# Patient Record
Sex: Male | Born: 1965 | Race: Black or African American | Hispanic: No | Smoking: Never smoker
Health system: Southern US, Community
[De-identification: ages and names within clinical notes are randomized; demographics above are authoritative.]

## PROBLEM LIST (undated history)

## (undated) DIAGNOSIS — R600 Localized edema: Secondary | ICD-10-CM

## (undated) DIAGNOSIS — K259 Gastric ulcer, unspecified as acute or chronic, without hemorrhage or perforation: Secondary | ICD-10-CM

## (undated) DIAGNOSIS — R7303 Prediabetes: Secondary | ICD-10-CM

## (undated) DIAGNOSIS — E7800 Pure hypercholesterolemia, unspecified: Secondary | ICD-10-CM

## (undated) DIAGNOSIS — M1990 Unspecified osteoarthritis, unspecified site: Secondary | ICD-10-CM

## (undated) DIAGNOSIS — K219 Gastro-esophageal reflux disease without esophagitis: Secondary | ICD-10-CM

## (undated) DIAGNOSIS — I10 Essential (primary) hypertension: Secondary | ICD-10-CM

## (undated) DIAGNOSIS — M2550 Pain in unspecified joint: Secondary | ICD-10-CM

## (undated) HISTORY — PX: UPPER GI ENDOSCOPY: SHX6162

## (undated) HISTORY — PX: UMBILICAL HERNIA REPAIR: SHX196

## (undated) HISTORY — DX: Pain in unspecified joint: M25.50

## (undated) HISTORY — DX: Unspecified osteoarthritis, unspecified site: M19.90

## (undated) HISTORY — PX: OTHER SURGICAL HISTORY: SHX169

## (undated) HISTORY — DX: Pure hypercholesterolemia, unspecified: E78.00

## (undated) HISTORY — DX: Gastric ulcer, unspecified as acute or chronic, without hemorrhage or perforation: K25.9

## (undated) HISTORY — DX: Localized edema: R60.0

## (undated) HISTORY — DX: Prediabetes: R73.03

## (undated) HISTORY — DX: Essential (primary) hypertension: I10

---

## 2011-06-17 DIAGNOSIS — E785 Hyperlipidemia, unspecified: Secondary | ICD-10-CM | POA: Insufficient documentation

## 2011-06-17 DIAGNOSIS — I10 Essential (primary) hypertension: Secondary | ICD-10-CM | POA: Insufficient documentation

## 2020-04-10 DIAGNOSIS — E1169 Type 2 diabetes mellitus with other specified complication: Secondary | ICD-10-CM | POA: Insufficient documentation

## 2020-04-10 DIAGNOSIS — M1611 Unilateral primary osteoarthritis, right hip: Secondary | ICD-10-CM | POA: Insufficient documentation

## 2020-04-10 DIAGNOSIS — I152 Hypertension secondary to endocrine disorders: Secondary | ICD-10-CM | POA: Insufficient documentation

## 2020-04-10 DIAGNOSIS — E785 Hyperlipidemia, unspecified: Secondary | ICD-10-CM | POA: Insufficient documentation

## 2020-04-10 DIAGNOSIS — E1159 Type 2 diabetes mellitus with other circulatory complications: Secondary | ICD-10-CM | POA: Insufficient documentation

## 2020-04-10 DIAGNOSIS — E119 Type 2 diabetes mellitus without complications: Secondary | ICD-10-CM | POA: Insufficient documentation

## 2020-06-29 ENCOUNTER — Encounter (INDEPENDENT_AMBULATORY_CARE_PROVIDER_SITE_OTHER): Payer: Self-pay | Admitting: Bariatrics

## 2020-06-30 ENCOUNTER — Encounter (INDEPENDENT_AMBULATORY_CARE_PROVIDER_SITE_OTHER): Payer: Self-pay | Admitting: Bariatrics

## 2020-06-30 ENCOUNTER — Other Ambulatory Visit: Payer: Self-pay

## 2020-06-30 ENCOUNTER — Ambulatory Visit (INDEPENDENT_AMBULATORY_CARE_PROVIDER_SITE_OTHER): Payer: 59 | Admitting: Bariatrics

## 2020-06-30 VITALS — BP 143/87 | HR 67 | Temp 97.8°F | Ht 70.0 in | Wt 307.0 lb

## 2020-06-30 DIAGNOSIS — R5383 Other fatigue: Secondary | ICD-10-CM | POA: Diagnosis not present

## 2020-06-30 DIAGNOSIS — E669 Obesity, unspecified: Secondary | ICD-10-CM

## 2020-06-30 DIAGNOSIS — I10 Essential (primary) hypertension: Secondary | ICD-10-CM | POA: Diagnosis not present

## 2020-06-30 DIAGNOSIS — E559 Vitamin D deficiency, unspecified: Secondary | ICD-10-CM

## 2020-06-30 DIAGNOSIS — E785 Hyperlipidemia, unspecified: Secondary | ICD-10-CM

## 2020-06-30 DIAGNOSIS — Z1331 Encounter for screening for depression: Secondary | ICD-10-CM

## 2020-06-30 DIAGNOSIS — Z6841 Body Mass Index (BMI) 40.0 and over, adult: Secondary | ICD-10-CM

## 2020-06-30 DIAGNOSIS — Z9189 Other specified personal risk factors, not elsewhere classified: Secondary | ICD-10-CM

## 2020-06-30 DIAGNOSIS — Z0289 Encounter for other administrative examinations: Secondary | ICD-10-CM

## 2020-06-30 DIAGNOSIS — E1169 Type 2 diabetes mellitus with other specified complication: Secondary | ICD-10-CM

## 2020-06-30 DIAGNOSIS — R0602 Shortness of breath: Secondary | ICD-10-CM | POA: Diagnosis not present

## 2020-06-30 DIAGNOSIS — E66813 Obesity, class 3: Secondary | ICD-10-CM

## 2020-07-01 ENCOUNTER — Encounter (INDEPENDENT_AMBULATORY_CARE_PROVIDER_SITE_OTHER): Payer: Self-pay | Admitting: Bariatrics

## 2020-07-01 DIAGNOSIS — E559 Vitamin D deficiency, unspecified: Secondary | ICD-10-CM | POA: Insufficient documentation

## 2020-07-01 LAB — INSULIN, RANDOM: INSULIN: 13.5 u[IU]/mL (ref 2.6–24.9)

## 2020-07-01 LAB — VITAMIN D 25 HYDROXY (VIT D DEFICIENCY, FRACTURES): Vit D, 25-Hydroxy: 22.5 ng/mL — ABNORMAL LOW (ref 30.0–100.0)

## 2020-07-01 LAB — C-PEPTIDE: C-Peptide: 3.5 ng/mL (ref 1.1–4.4)

## 2020-07-01 NOTE — Progress Notes (Signed)
Chief Complaint:   OBESITY William Blevins (MR# [id number]) is a 55 y.o. male who presents for evaluation and treatment of obesity and related comorbidities. Current BMI is Body mass index is 44.05 kg/m. [name] has been struggling with his weight for many years and has been unsuccessful in either losing weight, maintaining weight loss, or reaching his healthy weight goal.  [name] is planning on having hip surgery (right hip), and he is considering and needs his BMI <40 or less. He does like to cook but he notes time as an obstacle.  [name] is currently in the action stage of change and ready to dedicate time achieving and maintaining a healthier weight. [name] is interested in becoming our patient and working on intensive lifestyle modifications including (but not limited to) diet and exercise for weight loss.  [name]'s habits were reviewed today and are as follows: His family eats meals together, his desired weight loss is 47 lbs, he has been heavy most of his life, he started gaining weight 2 years ago, his heaviest weight ever was 337 pounds, he has significant food cravings issues, he skips meals frequently, he is frequently drinking liquids with calories, he frequently makes poor food choices, he frequently eats larger portions than normal and he struggles with emotional eating.  Depression Screen [name]'s Food and Mood (modified PHQ-9) score was 3.  Depression screen PHQ 2/9 06/30/2020  Decreased Interest 1  Down, Depressed, Hopeless 1  PHQ - 2 Score 2  Altered sleeping 0  Tired, decreased energy 1  Change in appetite 0  Feeling bad or failure about yourself  0  Trouble concentrating 0  Moving slowly or fidgety/restless 0  Suicidal thoughts 0  PHQ-9 Score 3  Difficult doing work/chores Not difficult at all   Subjective:   1. Other fatigue Lawyer admits to daytime somnolence and denies waking up still tired. Patent has a history of symptoms of daytime fatigue. [name]  generally gets 6 or 7 hours of sleep per night, and states that he has generally restful sleep. Snoring is present. Apneic episodes are not present. Epworth Sleepiness Score is 6.  2. SOB (shortness of breath) on exertion Sofia notes increasing shortness of breath with exercising and seems to be worsening over time with weight gain. He notes getting out of breath sooner with activity than he used to. This has not gotten worse recently. [name] denies shortness of breath at rest or orthopnea.  3. Diabetes mellitus type 2 in obese (HCC) [name] is not on medications, and his last A1c was 6.0. he has a family history of diabetes mellitus with his grandfather.  4. Essential hypertension [name]'s blood pressure is reasonably well controlled.  5. Hyperlipidemia associated with type 2 diabetes mellitus (HCC) [name] is not on medications currently.  6. Vitamin D deficiency [name] is not on Vit D currently.  7. At risk for hypoglycemia [name] is at increased risk for hypoglycemia due to changes in diet, diagnosis of diabetes mellitus II.  Assessment/Plan:   1. Other fatigue Burhanuddin does feel that his weight is causing his energy to be lower than it should be. Fatigue may be related to obesity, depression or many other causes. Labs will be ordered, and in the meanwhile, [name] will focus on self care including making healthy food choices, increasing physical activity and focusing on stress reduction.  - EKG 12-Lead - Insulin, random - VITAMIN D 25 Hydroxy (Vit-D Deficiency, Fractures)  2. SOB (shortness of breath) on exertion [name] does  feel that he gets out of breath more easily that he used to when he exercises. [name]'s shortness of breath appears to be obesity related and exercise induced. He has agreed to work on weight loss and gradually increase exercise to treat his exercise induced shortness of breath. Will continue to monitor closely.  3. Diabetes mellitus type 2 in obese [name]) We  will check labs today, and we discussed GLP-1's briefly. Good blood sugar control is important to decrease the likelihood of diabetic complications such as nephropathy, neuropathy, limb loss, blindness, coronary artery disease, and death. Intensive lifestyle modification including diet, exercise and weight loss are the first line of treatment for diabetes.   - Insulin, random - C-peptide  4. Essential hypertension [name] will continue his medications, and will work on healthy weight loss and exercise to improve blood pressure control. We will watch for signs of hypotension as he continues his lifestyle modifications.  5. Hyperlipidemia associated with type 2 diabetes mellitus (HCC) Cardiovascular risk and specific lipid/LDL goals reviewed. We discussed several lifestyle modifications today. [name] will work on diet, exercise and weight loss efforts. He is to decrease saturated fats, increase PUFAs and MUFAs. Orders and follow up as documented in patient record.   Counseling Intensive lifestyle modifications are the first line treatment for this issue. . Dietary changes: Increase soluble fiber. Decrease simple carbohydrates. . Exercise changes: Moderate to vigorous-intensity aerobic activity 150 minutes per week if tolerated. . Lipid-lowering medications: see documented in medical record.  6. Vitamin D deficiency Low Vitamin D level contributes to fatigue and are associated with obesity, breast, and colon cancer. We will check labs today. [name] will follow-up for routine testing of Vitamin D, at least 2-3 times per year to avoid over-replacement.  - VITAMIN D 25 Hydroxy (Vit-D Deficiency, Fractures)  7. Depression screen [name] had a negative depression screening. Depression is commonly associated with obesity and often results in emotional eating behaviors. We will monitor this closely and work on CBT to help improve the non-hunger eating patterns. Referral to Psychology may be required if no  improvement is seen as he continues in our clinic.  8. At risk for hypoglycemia [name] was given approximately 15 minutes of counseling today regarding prevention of hypoglycemia. He was advised of symptoms of hypoglycemia. Summit was instructed to avoid skipping meals, eat regular protein rich meals and schedule low calorie snacks as needed.   Repetitive spaced learning was employed today to elicit superior memory formation and behavioral change  9. Obesity, current BMI 44 Ponce is currently in the action stage of change and his goal is to continue with weight loss efforts. I recommend Jermaine begin the structured treatment plan as follows:   He has agreed to the Category 2 Plan or keeping a food journal and adhering to recommended goals of 1200 calories and 80 grams of protein daily.  Intentional eating was discussed. He is to stop all juice, no sugary drinks. I reviewed labs from 04/03/2020 and 06/26/2020 with the patient today.  Exercise goals: No exercise has been prescribed at this time.   Behavioral modification strategies: increasing lean protein intake, decreasing simple carbohydrates, increasing vegetables, increasing water intake, decreasing eating out, no skipping meals, meal planning and cooking strategies, keeping healthy foods in the home and planning for success.  He was informed of the importance of frequent follow-up visits to maximize his success with intensive lifestyle modifications for his multiple health conditions. He was informed we would discuss his lab results at his next  visit unless there is a critical issue that needs to be addressed sooner. Abdifatah agreed to keep his next visit at the agreed upon time to discuss these results.  Objective:   Blood pressure (!) 143/87, pulse 67, temperature 97.8 F (36.6 C), height 5\' 10"  (1.778 m), weight (!) 307 lb (139.3 kg), SpO2 97 %. Body mass index is 44.05 kg/m.  EKG: Normal sinus rhythm, rate 58 BPM.  Indirect  Calorimeter completed today shows a VO2 of 229 and a REE of 1592.  His calculated basal metabolic rate is thus his basal metabolic rate is worse than expected.  General: Cooperative, alert, well developed, in no acute distress. HEENT: Conjunctivae and lids unremarkable. Cardiovascular: Regular rhythm.  Lungs: Normal work of breathing. Neurologic: No focal deficits.   No results found for: CREATININE, BUN, NA, K, CL, CO2 No results found for: ALT, AST, GGT, ALKPHOS, BILITOT No results found for: HGBA1C Lab Results  Component Value Date   INSULIN 13.5 06/30/2020   No results found for: TSH No results found for: CHOL, HDL, LDLCALC, LDLDIRECT, TRIG, CHOLHDL No results found for: WBC, HGB, HCT, MCV, PLT No results found for: IRON, TIBC, FERRITIN  Attestation Statements:   Reviewed by clinician on day of visit: allergies, medications, problem list, medical history, surgical history, family history, social history, and previous encounter notes.   07/02/2020, am acting as Trude Mcburney for Energy manager, DO.  I have reviewed the above documentation for accuracy and completeness, and I agree with the above. [employer], DO

## 2020-07-14 ENCOUNTER — Other Ambulatory Visit: Payer: Self-pay

## 2020-07-14 ENCOUNTER — Ambulatory Visit (INDEPENDENT_AMBULATORY_CARE_PROVIDER_SITE_OTHER): Payer: 59 | Admitting: Bariatrics

## 2020-07-14 ENCOUNTER — Encounter (INDEPENDENT_AMBULATORY_CARE_PROVIDER_SITE_OTHER): Payer: Self-pay | Admitting: Bariatrics

## 2020-07-14 VITALS — BP 145/77 | HR 61 | Temp 98.0°F | Ht 70.0 in | Wt 304.0 lb

## 2020-07-14 DIAGNOSIS — E1169 Type 2 diabetes mellitus with other specified complication: Secondary | ICD-10-CM

## 2020-07-14 DIAGNOSIS — Z6841 Body Mass Index (BMI) 40.0 and over, adult: Secondary | ICD-10-CM

## 2020-07-14 DIAGNOSIS — E559 Vitamin D deficiency, unspecified: Secondary | ICD-10-CM

## 2020-07-14 DIAGNOSIS — Z9189 Other specified personal risk factors, not elsewhere classified: Secondary | ICD-10-CM | POA: Diagnosis not present

## 2020-07-14 DIAGNOSIS — E66813 Obesity, class 3: Secondary | ICD-10-CM

## 2020-07-14 MED ORDER — VITAMIN D (ERGOCALCIFEROL) 1.25 MG (50000 UNIT) PO CAPS: 50000.0000 [IU] | ORAL_CAPSULE | ORAL | 0 refills | Status: DC

## 2020-07-16 NOTE — Progress Notes (Signed)
Chief Complaint:   OBESITY William Blevins is here to discuss his progress with his obesity treatment plan along with follow-up of his obesity related diagnoses. William Blevins is on the Category 2 Plan and states he is following his eating plan approximately 50% of the time. William Blevins states he is not currently exercising.  Today's visit was #: 2 Starting weight: 307 lbs Starting date: 06/30/2020 Today's weight: 304 lbs Today's date: 07/14/2020 Total lbs lost to date: 3 Total lbs lost since last in-office visit: 3  Interim History: William Blevins is down 3 lbs since his last visit. He states that he had to go shopping and only done the plan for 1 week.  Subjective:   1. Vitamin D insufficiency William Blevins's Vit D level is 22.5. He is not on Vit D supplementation.  2. Type 2 diabetes mellitus with other specified complication, without long-term current use of insulin (HCC) William Blevins's last A1c was 6.0. His insulin level is 13.5 and C-peptide 3.5. He reports appetite is reasonable and denies cravings.  3. At risk for osteoporosis William Blevins is at higher risk of osteopenia and osteoporosis due to Vitamin D deficiency, obesity, and decreased activity.  Assessment/Plan:   1. Vitamin D insufficiency Low Vitamin D level contributes to fatigue and are associated with obesity, breast, and colon cancer. He agrees to start to take prescription Vitamin D @50 ,000 IU every week and will follow-up for routine testing of Vitamin D, at least 2-3 times per year to avoid over-replacement.  - Vitamin D, Ergocalciferol, (DRISDOL) 1.25 MG (50000 UNIT) CAPS capsule; Take 1 capsule (50,000 Units total) by mouth every 7 (seven) days.  Dispense: 5 capsule; Refill: 0  2. Type 2 diabetes mellitus with other specified complication, without long-term current use of insulin (HCC) Good blood sugar control is important to decrease the likelihood of diabetic complications such as nephropathy, neuropathy, limb loss, blindness, coronary artery  disease, and death. Intensive lifestyle modification including diet, exercise and weight loss are the first line of treatment for diabetes.   3. At risk for osteoporosis William Blevins was given approximately 15 minutes of osteoporosis prevention counseling today. William Blevins is at risk for osteopenia and osteoporosis due to his Vitamin D deficiency. He was encouraged to take his Vitamin D and follow his higher calcium diet and increase strengthening exercise to help strengthen his bones and decrease his risk of osteopenia and osteoporosis.  Repetitive spaced learning was employed today to elicit superior memory formation and behavioral change.  4. Obesity, current BMI 43 William Blevins is currently in the action stage of change. As such, his goal is to continue with weight loss efforts. He has agreed to the Category 2 Plan.   Meal plan Intentional eating Will adhere closely tot he plan Reviewed labs: Vit D, insulin, and C-peptide.  Exercise goals: As is  Behavioral modification strategies: increasing lean protein intake, decreasing simple carbohydrates, increasing vegetables, increasing water intake, decreasing eating out, no skipping meals, meal planning and cooking strategies, keeping healthy foods in the home, dealing with family or coworker sabotage, travel eating strategies, holiday eating strategies , celebration eating strategies and avoiding temptations.  William Blevins has agreed to follow-up with our clinic in 2 weeks. He was informed of the importance of frequent follow-up visits to maximize his success with intensive lifestyle modifications for his multiple health conditions.   Objective:   Blood pressure (!) 145/77, pulse 61, temperature 98 F (36.7 C), height 5\' 10"  (1.778 m), weight (!) 304 lb (137.9 kg), SpO2 93 %. Body mass  index is 43.62 kg/m.  General: Cooperative, alert, well developed, in no acute distress. HEENT: Conjunctivae and lids unremarkable. Cardiovascular: Regular rhythm.  Lungs:  Normal work of breathing. Neurologic: No focal deficits.   No results found for: CREATININE, BUN, NA, K, CL, CO2 No results found for: ALT, AST, GGT, ALKPHOS, BILITOT No results found for: HGBA1C Lab Results  Component Value Date   INSULIN 13.5 06/30/2020   No results found for: TSH No results found for: CHOL, HDL, LDLCALC, LDLDIRECT, TRIG, CHOLHDL No results found for: WBC, HGB, HCT, MCV, PLT No results found for: IRON, TIBC, FERRITIN   Attestation Statements:   Reviewed by clinician on day of visit: allergies, medications, problem list, medical history, surgical history, family history, social history, and previous encounter notes.  Edmund Hilda, CMA, am acting as Energy manager for [employer], DO.  I have reviewed the above documentation for accuracy and completeness, and I agree with the above. Corinna Capra, DO

## 2020-07-20 ENCOUNTER — Encounter (INDEPENDENT_AMBULATORY_CARE_PROVIDER_SITE_OTHER): Payer: Self-pay | Admitting: Bariatrics

## 2020-08-03 ENCOUNTER — Ambulatory Visit (INDEPENDENT_AMBULATORY_CARE_PROVIDER_SITE_OTHER): Payer: 59 | Admitting: Bariatrics

## 2020-08-15 ENCOUNTER — Other Ambulatory Visit (INDEPENDENT_AMBULATORY_CARE_PROVIDER_SITE_OTHER): Payer: Self-pay | Admitting: Bariatrics

## 2020-08-15 DIAGNOSIS — E559 Vitamin D deficiency, unspecified: Secondary | ICD-10-CM

## 2020-08-18 NOTE — Telephone Encounter (Signed)
Dr.Brown 

## 2020-08-20 ENCOUNTER — Ambulatory Visit (INDEPENDENT_AMBULATORY_CARE_PROVIDER_SITE_OTHER): Payer: 59 | Admitting: Bariatrics

## 2020-08-31 ENCOUNTER — Encounter (INDEPENDENT_AMBULATORY_CARE_PROVIDER_SITE_OTHER): Payer: Self-pay | Admitting: Bariatrics

## 2020-08-31 ENCOUNTER — Ambulatory Visit (INDEPENDENT_AMBULATORY_CARE_PROVIDER_SITE_OTHER): Payer: 59 | Admitting: Bariatrics

## 2020-08-31 ENCOUNTER — Other Ambulatory Visit: Payer: Self-pay

## 2020-08-31 VITALS — BP 138/75 | HR 66 | Temp 97.9°F | Ht 70.0 in | Wt 283.0 lb

## 2020-08-31 DIAGNOSIS — Z6841 Body Mass Index (BMI) 40.0 and over, adult: Secondary | ICD-10-CM

## 2020-08-31 DIAGNOSIS — I10 Essential (primary) hypertension: Secondary | ICD-10-CM | POA: Diagnosis not present

## 2020-08-31 DIAGNOSIS — Z9189 Other specified personal risk factors, not elsewhere classified: Secondary | ICD-10-CM

## 2020-08-31 DIAGNOSIS — E559 Vitamin D deficiency, unspecified: Secondary | ICD-10-CM | POA: Diagnosis not present

## 2020-09-04 ENCOUNTER — Other Ambulatory Visit: Payer: Self-pay | Admitting: Orthopaedic Surgery

## 2020-09-04 DIAGNOSIS — Z01818 Encounter for other preprocedural examination: Secondary | ICD-10-CM

## 2020-09-07 ENCOUNTER — Encounter (INDEPENDENT_AMBULATORY_CARE_PROVIDER_SITE_OTHER): Payer: Self-pay | Admitting: Bariatrics

## 2020-09-07 NOTE — Progress Notes (Signed)
Chief Complaint:   OBESITY William Blevins is here to discuss his progress with his obesity treatment plan along with follow-up of his obesity related diagnoses. William Blevins is on the Category 2 Plan and states he is following his eating plan approximately 99% of the time. William Blevins states he is walking 1 mile 5 times per week.  Today's visit was #: 3 Starting weight: 307 lbs Starting date: 06/30/2020 Today's weight: 283 lbs Today's date: 08/31/2020 Total lbs lost to date: 24 Total lbs lost since last in-office visit: 21  Interim History: William Blevins is down 21 lbs from 07/14/20. He is more active at work. He is getting adequate protein.  Subjective:   1. Vitamin D insufficiency William Blevins was prescribed Vit D at his last visit.  2. Essential hypertension William Blevins is taking Coreg, Norvasc, and Hyzaar. His BP is controlled.  3. At risk for dehydration William Blevins is at risk for dehydration due to exercise, obesity, and weather changes.  Assessment/Plan:   1. Vitamin D insufficiency Low Vitamin D level contributes to fatigue and are associated with obesity, breast, and colon cancer. He agrees to continue to take prescription Vitamin D @50 ,000 IU every week and will follow-up for routine testing of Vitamin D, at least 2-3 times per year to avoid over-replacement.  2. Essential hypertension William Blevins is working on healthy weight loss and exercise to improve blood pressure control. We will watch for signs of hypotension as he continues his lifestyle modifications. Continue current treatment plan.  3. At risk for dehydration William Blevins was given approximately 15 minutes dehydration prevention counseling today. William Blevins is at risk for dehydration due to weight loss and current medication(s). He was encouraged to hydrate and monitor fluid status to avoid dehydration as well as weight loss plateaus.    4. Obesity, current BMI 40.7  William Blevins is currently in the action stage of change. As such, his goal is to continue with  weight loss efforts. He has agreed to the Category 2 Plan.   Meal planning Intentional eating  Exercise goals:  As is  Behavioral modification strategies: increasing lean protein intake, decreasing simple carbohydrates, increasing vegetables, increasing water intake, decreasing eating out, no skipping meals, meal planning and cooking strategies, keeping healthy foods in the home, and planning for success.  Taber has agreed to follow-up with our clinic in 3-4 weeks. He was informed of the importance of frequent follow-up visits to maximize his success with intensive lifestyle modifications for his multiple health conditions.   Objective:   Blood pressure 138/75, pulse 66, temperature 97.9 F (36.6 C), height 5\' 10"  (1.778 m), weight 283 lb (128.4 kg), SpO2 97 %. Body mass index is 40.61 kg/m.  General: Cooperative, alert, well developed, in no acute distress. HEENT: Conjunctivae and lids unremarkable. Cardiovascular: Regular rhythm.  Lungs: Normal work of breathing. Neurologic: No focal deficits.   No results found for: CREATININE, BUN, NA, K, CL, CO2 No results found for: ALT, AST, GGT, ALKPHOS, BILITOT No results found for: HGBA1C Lab Results  Component Value Date   INSULIN 13.5 06/30/2020   No results found for: TSH No results found for: CHOL, HDL, LDLCALC, LDLDIRECT, TRIG, CHOLHDL Lab Results  Component Value Date   VD25OH 22.5 (L) 06/30/2020   No results found for: WBC, HGB, HCT, MCV, PLT No results found for: IRON, TIBC, FERRITIN   Attestation Statements:   Reviewed by clinician on day of visit: allergies, medications, problem list, medical history, surgical history, family history, social history, and previous encounter notes.  Edmund Hilda, CMA, am acting as Energy manager for [employer], DO.  I have reviewed the above documentation for accuracy and completeness, and I agree with the above. Corinna Capra, DO

## 2020-09-20 ENCOUNTER — Other Ambulatory Visit (INDEPENDENT_AMBULATORY_CARE_PROVIDER_SITE_OTHER): Payer: Self-pay | Admitting: Bariatrics

## 2020-09-20 DIAGNOSIS — E559 Vitamin D deficiency, unspecified: Secondary | ICD-10-CM

## 2020-09-28 ENCOUNTER — Telehealth (INDEPENDENT_AMBULATORY_CARE_PROVIDER_SITE_OTHER): Payer: 59 | Admitting: Bariatrics

## 2020-09-30 NOTE — Patient Instructions (Addendum)
DUE TO COVID-19 ONLY ONE VISITOR IS ALLOWED TO COME WITH YOU AND STAY IN THE WAITING ROOM ONLY DURING PRE OP AND PROCEDURE.   **NO VISITORS ARE ALLOWED IN THE SHORT STAY AREA OR RECOVERY ROOM!!**   Your procedure is scheduled on: 10/13/20   Report to [facility] Main  Entrance   Report to Short Stay at 5:15 AM   [facility])   Call this number if you have problems the morning of surgery [phone]   Do not eat food :After Midnight.   May have liquids until  4:30 AM day of surgery  CLEAR LIQUID DIET  Foods Allowed                                                                     Foods Excluded  Water, Black Coffee and tea, regular and decaf                 liquids that you cannot  Plain Jell-O in any flavor  (No red)                                       see through such as: Fruit ices (not with fruit pulp)                                              milk, soups, orange juice              Iced Popsicles (No red)                                                  All solid food                                   Apple juices Sports drinks like Gatorade (No red) Lightly seasoned clear broth or consume(fat free) Sugar, honey syrup     The day of surgery:  Drink ONE (1) Pre-Surgery Clear Ensure or G2 by 4:30 am the morning of surgery. Drink in one sitting. Do not sip.  This drink was given to you during your hospital  pre-op appointment visit. Nothing else to drink after completing the  Pre-Surgery Clear Ensure or G2.          If you have questions, please contact your surgeon's office.     Oral Hygiene is also important to reduce your risk of infection.                                    Remember - BRUSH YOUR TEETH THE MORNING OF SURGERY WITH YOUR REGULAR TOOTHPASTE   Do NOT smoke after Midnight   Take these medicines the morning of surgery with A SIP OF WATER: Amlodipine, Coreg, Omeprazole.  DO NOT TAKE ANY ORAL DIABETIC MEDICATIONS DAY OF YOUR SURGERY  How  to Manage Your Diabetes Before and After Surgery  Why is it important to control my blood sugar before and after surgery? Improving blood sugar levels before and after surgery helps healing and can limit problems. A way of improving blood sugar control is eating a healthy diet by:  Eating less sugar and carbohydrates  Increasing activity/exercise  Talking with your doctor about reaching your blood sugar goals High blood sugars (greater than 180 mg/dL) can raise your risk of infections and slow your recovery, so you will need to focus on controlling your diabetes during the weeks before surgery. Make sure that the doctor who takes care of your diabetes knows about your planned surgery including the date and location.  How do I manage my blood sugar before surgery? Check your blood sugar at least 4 times a day, starting 2 days before surgery, to make sure that the level is not too high or low. Check your blood sugar the morning of your surgery when you wake up and every 2 hours until you get to the Short Stay unit. If your blood sugar is less than 70 mg/dL, you will need to treat for low blood sugar: Do not take insulin. Treat a low blood sugar (less than 70 mg/dL) with  cup of clear juice (cranberry or apple), 4 glucose tablets, OR glucose gel. Recheck blood sugar in 15 minutes after treatment (to make sure it is greater than 70 mg/dL). If your blood sugar is not greater than 70 mg/dL on recheck, call [phone] for further instructions. Report your blood sugar to the short stay nurse when you get to Short Stay.  If you are admitted to the hospital after surgery: Your blood sugar will be checked by the staff and you will probably be given insulin after surgery (instead of oral diabetes medicines) to make sure you have good blood sugar levels. The goal for blood sugar control after surgery is 80-180 mg/dL.   WHAT DO I DO ABOUT MY DIABETES MEDICATION?  Do not take oral diabetes medicines  (pills) the morning of surgery.  THE NIGHT BEFORE SURGERY, take     units of       insulin.  Take day before     THE MORNING OF SURGERY, take   units of         insulin. None day of    Reviewed and Endorsed by [facility] Patient Education Committee, August 2015                               You may not have any metal on your body including jewelry, and body piercing             Do not wear lotions, powders, cologne, or deodorant              Men may shave face and neck.   Do not bring valuables to the hospital. [facility] IS NOT             RESPONSIBLE   FOR VALUABLES.   Contacts, dentures or bridgework may not be worn into surgery.    Patients discharged the day of surgery will not be allowed to drive home.   Special Instructions: Bring a copy of your healthcare power of attorney and living will documents         the day of  surgery if you haven't scanned them in before.              Please read over the following fact sheets you were given: IF YOU HAVE QUESTIONS ABOUT YOUR PRE OP INSTRUCTIONS PLEASE CALL [phone]   [facility] - Preparing for Surgery Before surgery, you can play an important role.  Because skin is not sterile, your skin needs to be as free of germs as possible.  You can reduce the number of germs on your skin by washing with CHG (chlorahexidine gluconate) soap before surgery.  CHG is an antiseptic cleaner which kills germs and bonds with the skin to continue killing germs even after washing. Please DO NOT use if you have an allergy to CHG or antibacterial soaps.  If your skin becomes reddened/irritated stop using the CHG and inform your nurse when you arrive at Short Stay. Do not shave (including legs and underarms) for at least 48 hours prior to the first CHG shower.  You may shave your face/neck.  Please follow these instructions carefully:  1.  Shower with CHG Soap the night before surgery and the  morning of surgery.  2.  If you choose to wash your hair,  wash your hair first as usual with your normal  shampoo.  3.  After you shampoo, rinse your hair and body thoroughly to remove the shampoo.                             4.  Use CHG as you would any other liquid soap.  You can apply chg directly to the skin and wash.  Gently with a scrungie or clean washcloth.  5.  Apply the CHG Soap to your body ONLY FROM THE NECK DOWN.   Do   not use on face/ open                           Wound or open sores. Avoid contact with eyes, ears mouth and   genitals (private parts).                       Wash face,  Genitals (private parts) with your normal soap.             6.  Wash thoroughly, paying special attention to the area where your    surgery  will be performed.  7.  Thoroughly rinse your body with warm water from the neck down.  8.  DO NOT shower/wash with your normal soap after using and rinsing off the CHG Soap.                9.  Pat yourself dry with a clean towel.            10.  Wear clean pajamas.            11.  Place clean sheets on your bed the night of your first shower and do not  sleep with pets. Day of Surgery : Do not apply any lotions/deodorants the morning of surgery.  Please wear clean clothes to the hospital/surgery center.  FAILURE TO FOLLOW THESE INSTRUCTIONS MAY RESULT IN THE CANCELLATION OF YOUR SURGERY  PATIENT SIGNATURE_________________________________  NURSE SIGNATURE__________________________________  ________________________________________________________________________   Rogelia Mire  An incentive spirometer is a tool that can help keep your lungs clear and active. This tool measures how well you are  filling your lungs with each breath. Taking long deep breaths may help reverse or decrease the chance of developing breathing (pulmonary) problems (especially infection) following: A long period of time when you are unable to move or be active. BEFORE THE PROCEDURE  If the spirometer includes an indicator to show  your best effort, your nurse or respiratory therapist will set it to a desired goal. If possible, sit up straight or lean slightly forward. Try not to slouch. Hold the incentive spirometer in an upright position. INSTRUCTIONS FOR USE  Sit on the edge of your bed if possible, or sit up as far as you can in bed or on a chair. Hold the incentive spirometer in an upright position. Breathe out normally. Place the mouthpiece in your mouth and seal your lips tightly around it. Breathe in slowly and as deeply as possible, raising the piston or the ball toward the top of the column. Hold your breath for 3-5 seconds or for as long as possible. Allow the piston or ball to fall to the bottom of the column. Remove the mouthpiece from your mouth and breathe out normally. Rest for a few seconds and repeat Steps 1 through 7 at least 10 times every 1-2 hours when you are awake. Take your time and take a few normal breaths between deep breaths. The spirometer may include an indicator to show your best effort. Use the indicator as a goal to work toward during each repetition. After each set of 10 deep breaths, practice coughing to be sure your lungs are clear. If you have an incision (the cut made at the time of surgery), support your incision when coughing by placing a pillow or rolled up towels firmly against it. Once you are able to get out of bed, walk around indoors and cough well. You may stop using the incentive spirometer when instructed by your caregiver.  RISKS AND COMPLICATIONS Take your time so you do not get dizzy or light-headed. If you are in pain, you may need to take or ask for pain medication before doing incentive spirometry. It is harder to take a deep breath if you are having pain. AFTER USE Rest and breathe slowly and easily. It can be helpful to keep track of a log of your progress. Your caregiver can provide you with a simple table to help with this. If you are using the spirometer at home,  follow these instructions: SEEK MEDICAL CARE IF:  You are having difficultly using the spirometer. You have trouble using the spirometer as often as instructed. Your pain medication is not giving enough relief while using the spirometer. You develop fever of 100.5 F (38.1 C) or higher. SEEK IMMEDIATE MEDICAL CARE IF:  You cough up bloody sputum that had not been present before. You develop fever of 102 F (38.9 C) or greater. You develop worsening pain at or near the incision site. MAKE SURE YOU:  Understand these instructions. Will watch your condition. Will get help right away if you are not doing well or get worse. Document Released: 06/13/2006 Document Revised: 04/25/2011 Document Reviewed: 08/14/2006 ExitCare Patient Information 2014 ExitCare, [place].   ________________________________________________________________________    WHAT IS A BLOOD TRANSFUSION? Blood Transfusion Information  A transfusion is the replacement of blood or some of its parts. Blood is made up of multiple cells which provide different functions. Red blood cells carry oxygen and are used for blood loss replacement. White blood cells fight against infection. Platelets control bleeding. Plasma helps clot blood.  Other blood products are available for specialized needs, such as hemophilia or other clotting disorders. BEFORE THE TRANSFUSION  Who gives blood for transfusions?  Healthy volunteers who are fully evaluated to make sure their blood is safe. This is blood bank blood. Transfusion therapy is the safest it has ever been in the practice of medicine. Before blood is taken from a donor, a complete history is taken to make sure that person has no history of diseases nor engages in risky social behavior (examples are intravenous drug use or sexual activity with multiple partners). The donor's travel history is screened to minimize risk of transmitting infections, such as malaria. The donated blood is tested  for signs of infectious diseases, such as HIV and hepatitis. The blood is then tested to be sure it is compatible with you in order to minimize the chance of a transfusion reaction. If you or a relative donates blood, this is often done in anticipation of surgery and is not appropriate for emergency situations. It takes many days to process the donated blood. RISKS AND COMPLICATIONS Although transfusion therapy is very safe and saves many lives, the main dangers of transfusion include:  Getting an infectious disease. Developing a transfusion reaction. This is an allergic reaction to something in the blood you were given. Every precaution is taken to prevent this. The decision to have a blood transfusion has been considered carefully by your caregiver before blood is given. Blood is not given unless the benefits outweigh the risks. AFTER THE TRANSFUSION Right after receiving a blood transfusion, you will usually feel much better and more energetic. This is especially true if your red blood cells have gotten low (anemic). The transfusion raises the level of the red blood cells which carry oxygen, and this usually causes an energy increase. The nurse administering the transfusion will monitor you carefully for complications. HOME CARE INSTRUCTIONS  No special instructions are needed after a transfusion. You may find your energy is better. Speak with your caregiver about any limitations on activity for underlying diseases you may have. SEEK MEDICAL CARE IF:  Your condition is not improving after your transfusion. You develop redness or irritation at the intravenous (IV) site. SEEK IMMEDIATE MEDICAL CARE IF:  Any of the following symptoms occur over the next 12 hours: Shaking chills. You have a temperature by mouth above 102 F (38.9 C), not controlled by medicine. Chest, back, or muscle pain. People around you feel you are not acting correctly or are confused. Shortness of breath or difficulty  breathing. Dizziness and fainting. You get a rash or develop hives. You have a decrease in urine output. Your urine turns a dark color or changes to pink, red, or brown. Any of the following symptoms occur over the next 10 days: You have a temperature by mouth above 102 F (38.9 C), not controlled by medicine. Shortness of breath. Weakness after normal activity. The white part of the eye turns yellow (jaundice). You have a decrease in the amount of urine or are urinating less often. Your urine turns a dark color or changes to pink, red, or brown. Document Released: 01/29/2000 Document Revised: 04/25/2011 Document Reviewed: 09/17/2007 [facility] Patient Information 2014 [place], [place].  _______________________________________________________________________

## 2020-09-30 NOTE — Progress Notes (Addendum)
COVID swab appointment:  COVID Vaccine Completed: Date COVID Vaccine completed: Has received booster: COVID vaccine manufacturer: [employer]   Date of COVID positive in last 90 days:  PCP - Christena Flake, MD Cardiologist -   Chest x-ray -  EKG - 06/30/20 Epic, T wave abn Stress Test -  ECHO -  Cardiac Cath -  Pacemaker/ICD device last checked: Spinal Cord Stimulator:  Sleep Study -  CPAP -   Fasting Blood Sugar -  Checks Blood Sugar _____ times a day  Blood Thinner Instructions: Aspirin Instructions: Last Dose:  Activity level:  Can go up a flight of stairs and perform activities of daily living without stopping and without symptoms of chest pain or shortness of breath.   Able to exercise without symptoms  Unable to go up a flight of stairs without symptoms of      Anesthesia review:   Patient denies shortness of breath, fever, cough and chest pain at PAT appointment   Patient verbalized understanding of instructions that were given to them at the PAT appointment. Patient was also instructed that they will need to review over the PAT instructions again at home before surgery.

## 2020-10-01 ENCOUNTER — Encounter (HOSPITAL_COMMUNITY)
Admission: RE | Admit: 2020-10-01 | Discharge: 2020-10-01 | Disposition: A | Discharge status: Home / Self Care | Payer: 59 | Source: Ambulatory Visit | Attending: Anesthesiology | Admitting: Anesthesiology

## 2020-10-06 NOTE — Progress Notes (Addendum)
COVID swab appointment: N/a  COVID Vaccine Completed: yes x3 Date COVID Vaccine completed: Has received booster: COVID vaccine manufacturer: [employer]    [employer]   Date of COVID positive in last 90 days: No  PCP - Christena Flake, MD Cardiologist - No  Chest x-ray - 10/08/20 Epic EKG - 06/30/20 Epic, T wave abn Stress Test - many years ago ECHO - N/a Cardiac Cath - N/a Pacemaker/ICD device last checked: N/a Spinal Cord Stimulator: N/a  Sleep Study - N/a CPAP -   Fasting Blood Sugar -  Checks Blood Sugar _____ times a day Used to check BS, stopped a few months ago. PCP aware and okay with this per patient.  Blood Thinner Instructions:N/a Aspirin Instructions: Last Dose:  Activity level: Can go up a flight of stairs and perform activities of daily living without stopping and without symptoms of chest pain or shortness of breath.       Anesthesia review: HTN, STOP BANG 5, DM, pulmonary nodules on CXR  Patient denies shortness of breath, fever, cough and chest pain at PAT appointment   Patient verbalized understanding of instructions that were given to them at the PAT appointment. Patient was also instructed that they will need to review over the PAT instructions again at home before surgery.

## 2020-10-08 ENCOUNTER — Ambulatory Visit (HOSPITAL_COMMUNITY)
Admission: RE | Admit: 2020-10-08 | Discharge: 2020-10-08 | Disposition: A | Discharge status: Home / Self Care | Payer: 59 | Source: Ambulatory Visit | Attending: Orthopaedic Surgery | Admitting: Orthopaedic Surgery

## 2020-10-08 ENCOUNTER — Encounter (HOSPITAL_COMMUNITY)
Admission: RE | Admit: 2020-10-08 | Discharge: 2020-10-08 | Disposition: A | Discharge status: Home / Self Care | Payer: 59 | Source: Ambulatory Visit | Attending: Orthopaedic Surgery | Admitting: Orthopaedic Surgery

## 2020-10-08 ENCOUNTER — Other Ambulatory Visit: Payer: Self-pay

## 2020-10-08 ENCOUNTER — Encounter (HOSPITAL_COMMUNITY): Payer: Self-pay

## 2020-10-08 DIAGNOSIS — Z01818 Encounter for other preprocedural examination: Secondary | ICD-10-CM

## 2020-10-08 HISTORY — DX: Gastro-esophageal reflux disease without esophagitis: K21.9

## 2020-10-08 LAB — GLUCOSE, CAPILLARY: Glucose-Capillary: 92 mg/dL (ref 70–99)

## 2020-10-08 LAB — URINALYSIS, ROUTINE W REFLEX MICROSCOPIC
Bilirubin Urine: NEGATIVE
Glucose, UA: NEGATIVE mg/dL
Ketones, ur: 5 mg/dL — AB
Leukocytes,Ua: NEGATIVE
Nitrite: NEGATIVE
Protein, ur: NEGATIVE mg/dL
Specific Gravity, Urine: 1.013 (ref 1.005–1.030)
pH: 6 (ref 5.0–8.0)

## 2020-10-08 LAB — CBC WITH DIFFERENTIAL/PLATELET
Abs Immature Granulocytes: 0.01 10*3/uL (ref 0.00–0.07)
Basophils Absolute: 0 10*3/uL (ref 0.0–0.1)
Basophils Relative: 1 %
Eosinophils Absolute: 0.4 10*3/uL (ref 0.0–0.5)
Eosinophils Relative: 7 %
HCT: 40.1 % (ref 39.0–52.0)
Hemoglobin: 12.3 g/dL — ABNORMAL LOW (ref 13.0–17.0)
Immature Granulocytes: 0 %
Lymphocytes Relative: 29 %
Lymphs Abs: 1.4 10*3/uL (ref 0.7–4.0)
MCH: 29.3 pg (ref 26.0–34.0)
MCHC: 30.7 g/dL (ref 30.0–36.0)
MCV: 95.5 fL (ref 80.0–100.0)
Monocytes Absolute: 0.6 10*3/uL (ref 0.1–1.0)
Monocytes Relative: 11 %
Neutro Abs: 2.6 10*3/uL (ref 1.7–7.7)
Neutrophils Relative %: 52 %
Platelets: 182 10*3/uL (ref 150–400)
RBC: 4.2 MIL/uL — ABNORMAL LOW (ref 4.22–5.81)
RDW: 12.4 % (ref 11.5–15.5)
WBC: 5 10*3/uL (ref 4.0–10.5)
nRBC: 0 % (ref 0.0–0.2)

## 2020-10-08 LAB — BASIC METABOLIC PANEL
Anion gap: 8 (ref 5–15)
BUN: 25 mg/dL — ABNORMAL HIGH (ref 6–20)
CO2: 24 mmol/L (ref 22–32)
Calcium: 9.3 mg/dL (ref 8.9–10.3)
Chloride: 104 mmol/L (ref 98–111)
Creatinine, Ser: 1.5 mg/dL — ABNORMAL HIGH (ref 0.61–1.24)
GFR, Estimated: 55 mL/min — ABNORMAL LOW (ref >60)
Glucose, Bld: 80 mg/dL (ref 70–99)
Potassium: 4 mmol/L (ref 3.5–5.1)
Sodium: 136 mmol/L (ref 135–145)

## 2020-10-08 LAB — APTT: aPTT: 35 seconds (ref 24–36)

## 2020-10-08 LAB — PROTIME-INR
INR: 1.1 (ref 0.8–1.2)
Prothrombin Time: 14.6 seconds (ref 11.4–15.2)

## 2020-10-08 LAB — SURGICAL PCR SCREEN
MRSA, PCR: POSITIVE — AB
Staphylococcus aureus: POSITIVE — AB

## 2020-10-08 NOTE — Progress Notes (Addendum)
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 CXR with nodules. Results routed to Dr. Lorenda Peck.

## 2020-10-08 NOTE — Progress Notes (Signed)
   10/08/20 0816  OBSTRUCTIVE SLEEP APNEA  Have you ever been diagnosed with sleep apnea through a sleep study? No  Do you snore loudly (loud enough to be heard through closed doors)?  1  Do you often feel tired, fatigued, or sleepy during the daytime (such as falling asleep during driving or talking to someone)? 0  Has anyone observed you stop breathing during your sleep? 0  Do you have, or are you being treated for high blood pressure? 1  BMI more than 35 kg/m2? 1  Age > 50 (1-yes) 1  Neck circumference greater than:Male 16 inches or larger, Male 17inches or larger? 0  Male Gender (Yes=1) 1  Obstructive Sleep Apnea Score 5  Score 5 or greater  Results sent to PCP

## 2020-10-08 NOTE — Patient Instructions (Signed)
DUE TO COVID-19 ONLY ONE VISITOR IS ALLOWED TO COME WITH YOU AND STAY IN THE WAITING ROOM ONLY DURING PRE OP AND PROCEDURE.   **NO VISITORS ARE ALLOWED IN THE SHORT STAY AREA OR RECOVERY ROOM!!**       Your procedure is scheduled on: 10/13/20   Report to [facility] Main  Entrance   Report to Short Stay at 5:15 AM   [facility]   Call this number if you have problems the morning of surgery [phone]   Do not eat food :After Midnight.   May have liquids until 4:30 AM day of surgery  CLEAR LIQUID DIET  Foods Allowed                                                                     Foods Excluded  Water, Black Coffee and tea, regular and decaf                liquids that you cannot  Plain Jell-O in any flavor  (No red)                                     see through such as: Fruit ices (not with fruit pulp)                                             milk, soups, orange juice              Iced Popsicles (No red)                                                 All solid food                                   Apple juices Sports drinks like Gatorade (No red) Lightly seasoned clear broth or consume(fat free) Sugar, honey syrup      The day of surgery:  Drink ONE (1) Pre-Surgery G2 by 4:30 am the morning of surgery. Drink in one sitting. Do not sip.  This drink was given to you during your hospital  pre-op appointment visit. Nothing else to drink after completing the  Pre-Surgery G2.          If you have questions, please contact your surgeon's office.     Oral Hygiene is also important to reduce your risk of infection.                                    Remember - BRUSH YOUR TEETH THE MORNING OF SURGERY WITH YOUR REGULAR TOOTHPASTE   Take these medicines the morning of surgery with A SIP OF WATER: Amlodipine, Coreg, Omeprazole  You may not have any metal on your body including jewelry, and body piercing             Do not wear  lotions, powders, cologne, or deodorant  Do not wear nail polish including gel and S&S, artificial/acrylic nails, or any other type of covering on natural nails including finger and toenails. If you have artificial nails, gel coating, etc. that needs to be removed by a nail salon please have this removed prior to surgery or surgery may need to be canceled/ delayed if the surgeon/ anesthesia feels like they are unable to be safely monitored.   Do not shave  48 hours prior to surgery.               Men may shave face and neck.   Do not bring valuables to the hospital. [facility] IS NOT             RESPONSIBLE   FOR VALUABLES.    Patients discharged the day of surgery will not be allowed to drive home.   Please read over the following fact sheets you were given: IF YOU HAVE QUESTIONS ABOUT YOUR PRE OP INSTRUCTIONS PLEASE CALL [phone]-Maddox Bratcher   [facility] - Preparing for Surgery Before surgery, you can play an important role.  Because skin is not sterile, your skin needs to be as free of germs as possible.  You can reduce the number of germs on your skin by washing with CHG (chlorahexidine gluconate) soap before surgery.  CHG is an antiseptic cleaner which kills germs and bonds with the skin to continue killing germs even after washing. Please DO NOT use if you have an allergy to CHG or antibacterial soaps.  If your skin becomes reddened/irritated stop using the CHG and inform your nurse when you arrive at Short Stay. Do not shave (including legs and underarms) for at least 48 hours prior to the first CHG shower.  You may shave your face/neck.  Please follow these instructions carefully:  1.  Shower with CHG Soap the night before surgery and the  morning of surgery.  2.  If you choose to wash your hair, wash your hair first as usual with your normal  shampoo.  3.  After you shampoo, rinse your hair and body thoroughly to remove the shampoo.                             4.  Use CHG as you  would any other liquid soap.  You can apply chg directly to the skin and wash.  Gently with a scrungie or clean washcloth.  5.  Apply the CHG Soap to your body ONLY FROM THE NECK DOWN.   Do   not use on face/ open                           Wound or open sores. Avoid contact with eyes, ears mouth and   genitals (private parts).                       Wash face,  Genitals (private parts) with your normal soap.             6.  Wash thoroughly, paying special attention to the area where your    surgery  will be performed.  7.  Thoroughly rinse your body with warm water from the  neck down.  8.  DO NOT shower/wash with your normal soap after using and rinsing off the CHG Soap.                9.  Pat yourself dry with a clean towel.            10.  Wear clean pajamas.            11.  Place clean sheets on your bed the night of your first shower and do not  sleep with pets. Day of Surgery : Do not apply any lotions/deodorants the morning of surgery.  Please wear clean clothes to the hospital/surgery center.  FAILURE TO FOLLOW THESE INSTRUCTIONS MAY RESULT IN THE CANCELLATION OF YOUR SURGERY  PATIENT SIGNATURE_________________________________  NURSE SIGNATURE__________________________________  ________________________________________________________________________   Rogelia MireIncentive Spirometer  An incentive spirometer is a tool that can help keep your lungs clear and active. This tool measures how well you are filling your lungs with each breath. Taking long deep breaths may help reverse or decrease the chance of developing breathing (pulmonary) problems (especially infection) following: A long period of time when you are unable to move or be active. BEFORE THE PROCEDURE  If the spirometer includes an indicator to show your best effort, your nurse or respiratory therapist will set it to a desired goal. If possible, sit up straight or lean slightly forward. Try not to slouch. Hold the incentive spirometer  in an upright position. INSTRUCTIONS FOR USE  Sit on the edge of your bed if possible, or sit up as far as you can in bed or on a chair. Hold the incentive spirometer in an upright position. Breathe out normally. Place the mouthpiece in your mouth and seal your lips tightly around it. Breathe in slowly and as deeply as possible, raising the piston or the ball toward the top of the column. Hold your breath for 3-5 seconds or for as long as possible. Allow the piston or ball to fall to the bottom of the column. Remove the mouthpiece from your mouth and breathe out normally. Rest for a few seconds and repeat Steps 1 through 7 at least 10 times every 1-2 hours when you are awake. Take your time and take a few normal breaths between deep breaths. The spirometer may include an indicator to show your best effort. Use the indicator as a goal to work toward during each repetition. After each set of 10 deep breaths, practice coughing to be sure your lungs are clear. If you have an incision (the cut made at the time of surgery), support your incision when coughing by placing a pillow or rolled up towels firmly against it. Once you are able to get out of bed, walk around indoors and cough well. You may stop using the incentive spirometer when instructed by your caregiver.  RISKS AND COMPLICATIONS Take your time so you do not get dizzy or light-headed. If you are in pain, you may need to take or ask for pain medication before doing incentive spirometry. It is harder to take a deep breath if you are having pain. AFTER USE Rest and breathe slowly and easily. It can be helpful to keep track of a log of your progress. Your caregiver can provide you with a simple table to help with this. If you are using the spirometer at home, follow these instructions: SEEK MEDICAL CARE IF:  You are having difficultly using the spirometer. You have trouble using the spirometer as often as instructed.  Your pain medication is  not giving enough relief while using the spirometer. You develop fever of 100.5 F (38.1 C) or higher. SEEK IMMEDIATE MEDICAL CARE IF:  You cough up bloody sputum that had not been present before. You develop fever of 102 F (38.9 C) or greater. You develop worsening pain at or near the incision site. MAKE SURE YOU:  Understand these instructions. Will watch your condition. Will get help right away if you are not doing well or get worse. Document Released: 06/13/2006 Document Revised: 04/25/2011 Document Reviewed: 08/14/2006 [facility] Patient Information 2014 [place], [place].   ________________________________________________________________________

## 2020-10-13 ENCOUNTER — Encounter (HOSPITAL_COMMUNITY): Admission: RE | Payer: Self-pay | Source: Home / Self Care

## 2020-10-13 ENCOUNTER — Ambulatory Visit (HOSPITAL_COMMUNITY): Admission: RE | Admit: 2020-10-13 | Payer: 59 | Source: Home / Self Care | Admitting: Orthopaedic Surgery

## 2020-10-13 LAB — TYPE AND SCREEN
ABO/RH(D): AB POS
Antibody Screen: NEGATIVE

## 2020-10-13 SURGERY — ARTHROPLASTY, HIP, TOTAL, ANTERIOR APPROACH
Anesthesia: Spinal | Site: Hip | Laterality: Right

## 2021-09-22 ENCOUNTER — Encounter (INDEPENDENT_AMBULATORY_CARE_PROVIDER_SITE_OTHER): Payer: Self-pay

## 2022-06-11 IMAGING — DX DG CHEST 2V
2 series · 2 of 2 positions shown · non-contrast
Comparison: None.

CLINICAL DATA: Preoperative, right hip surgery

EXAM:
CHEST - 2 VIEW

[chest pa]
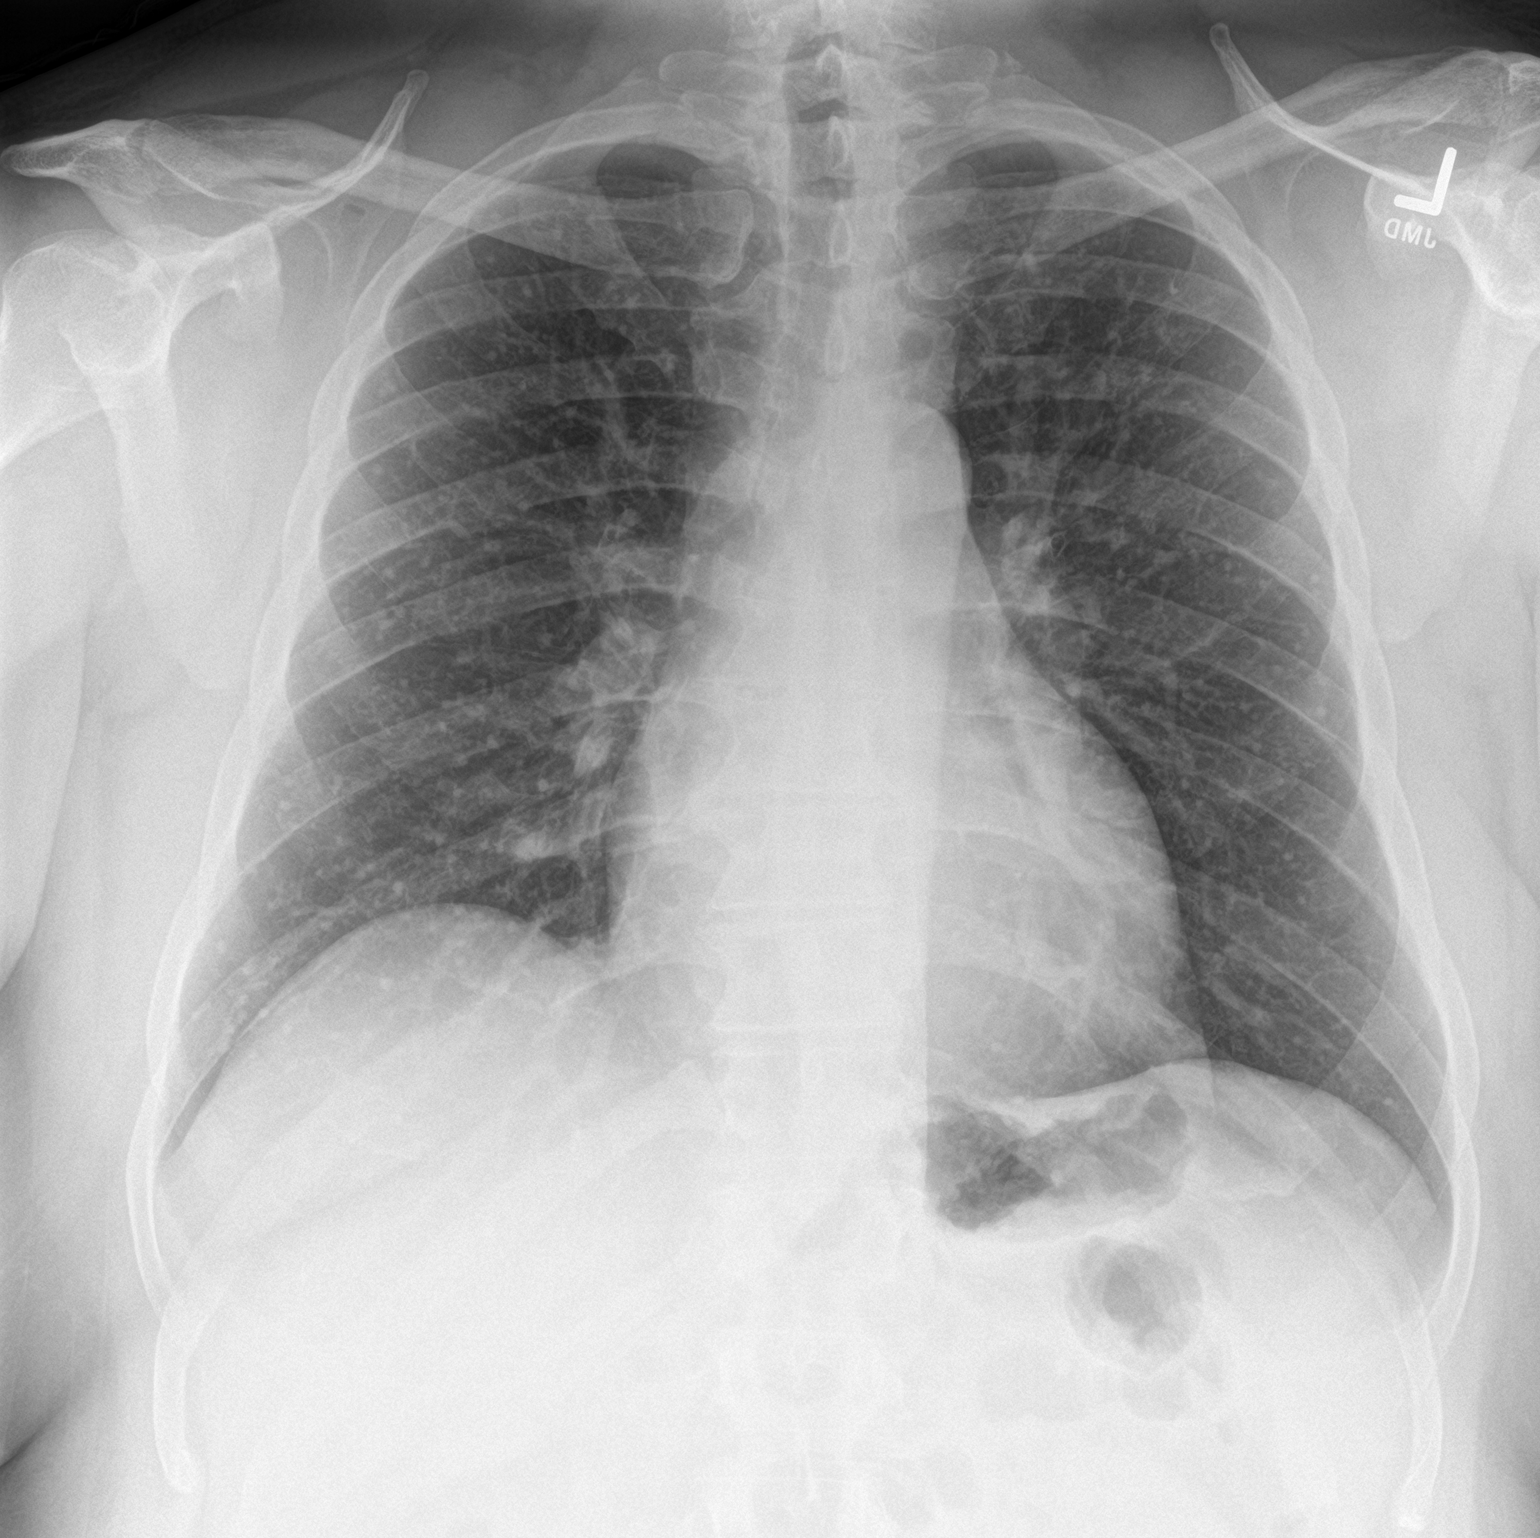

[chest lat]
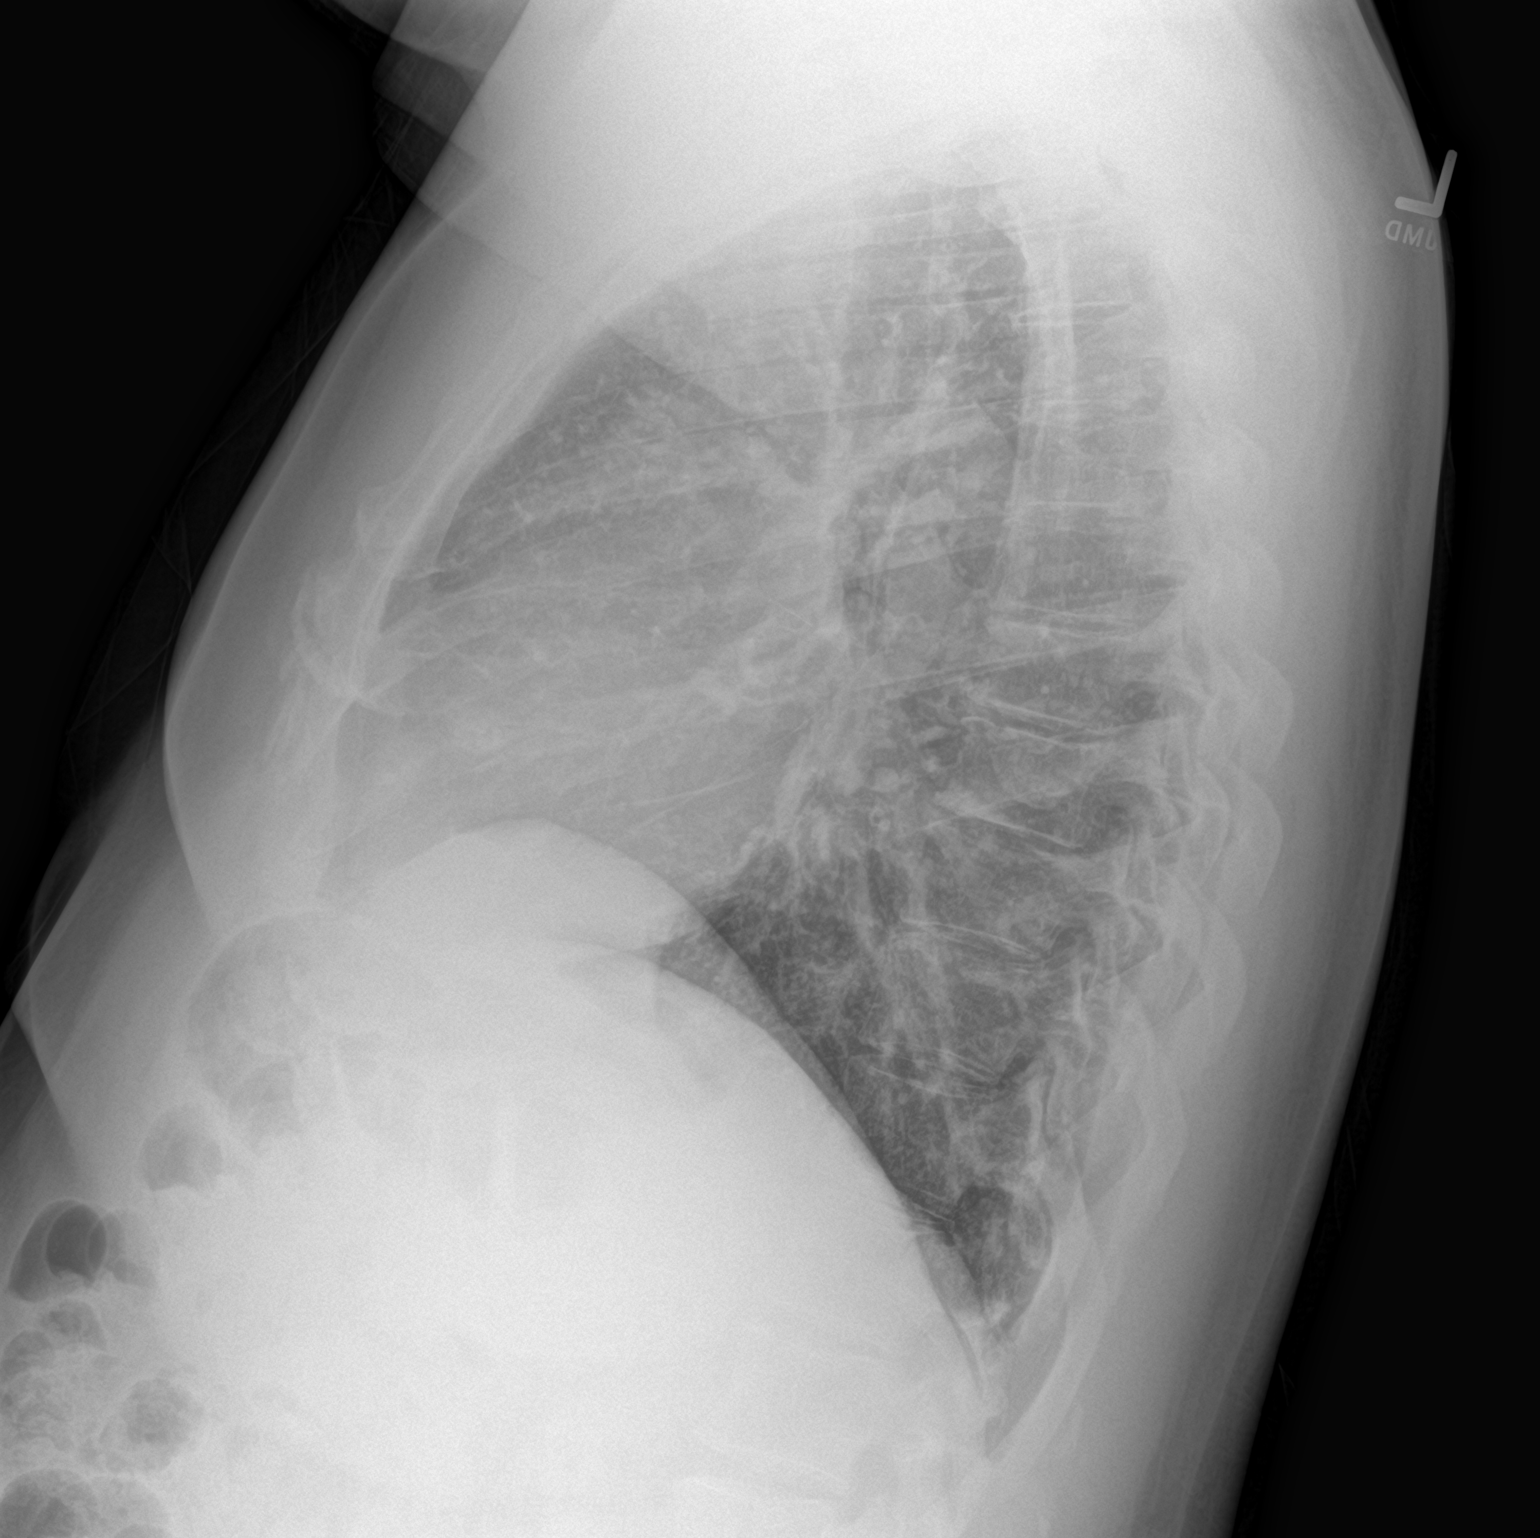

[2 of 2 positions shown; findings below may reference images not displayed]

FINDINGS: The heart size and mediastinal contours are within normal limits.
Innumerable small pulmonary nodules throughout the lungs. The
visualized skeletal structures are unremarkable.
IMPRESSION: Innumerable small pulmonary nodules throughout the lungs, possibly
calcified given apparent density, although indeterminate. Recommend
CT to further evaluate.
